# Patient Record
Sex: Female | Born: 1965 | Race: White | Hispanic: No | Marital: Married | Smoking: Never smoker
Health system: Southern US, Community
[De-identification: ages and names within clinical notes are randomized; demographics above are authoritative.]

## PROBLEM LIST (undated history)

## (undated) DIAGNOSIS — C4490 Unspecified malignant neoplasm of skin, unspecified: Secondary | ICD-10-CM

## (undated) DIAGNOSIS — J189 Pneumonia, unspecified organism: Secondary | ICD-10-CM

## (undated) DIAGNOSIS — N200 Calculus of kidney: Secondary | ICD-10-CM

## (undated) DIAGNOSIS — A770 Spotted fever due to Rickettsia rickettsii: Secondary | ICD-10-CM

## (undated) DIAGNOSIS — N946 Dysmenorrhea, unspecified: Secondary | ICD-10-CM

## (undated) DIAGNOSIS — B029 Zoster without complications: Secondary | ICD-10-CM

## (undated) DIAGNOSIS — G43909 Migraine, unspecified, not intractable, without status migrainosus: Secondary | ICD-10-CM

## (undated) HISTORY — DX: Dysmenorrhea, unspecified: N94.6

## (undated) HISTORY — DX: Unspecified malignant neoplasm of skin, unspecified: C44.90

## (undated) HISTORY — DX: Migraine, unspecified, not intractable, without status migrainosus: G43.909

## (undated) HISTORY — DX: Zoster without complications: B02.9

## (undated) HISTORY — DX: Spotted fever due to Rickettsia rickettsii: A77.0

## (undated) HISTORY — DX: Calculus of kidney: N20.0

---
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
## 1985-06-27 DIAGNOSIS — J189 Pneumonia, unspecified organism: Secondary | ICD-10-CM

## 1985-06-27 HISTORY — DX: Pneumonia, unspecified organism: J18.9

## 1997-11-19 ENCOUNTER — Other Ambulatory Visit: Admission: RE | Admit: 1997-11-19 | Discharge: 1997-11-19 | Payer: Self-pay | Admitting: Obstetrics and Gynecology

## 1998-10-03 ENCOUNTER — Inpatient Hospital Stay (HOSPITAL_COMMUNITY): Admission: AD | Admit: 1998-10-03 | Discharge: 1998-10-03 | Payer: Self-pay | Admitting: Obstetrics and Gynecology

## 1998-10-03 ENCOUNTER — Inpatient Hospital Stay (HOSPITAL_COMMUNITY): Admission: AD | Admit: 1998-10-03 | Discharge: 1998-10-05 | Payer: Self-pay | Admitting: Obstetrics and Gynecology

## 1998-11-13 ENCOUNTER — Other Ambulatory Visit: Admission: RE | Admit: 1998-11-13 | Discharge: 1998-11-13 | Payer: Self-pay | Admitting: Obstetrics and Gynecology

## 1999-08-31 ENCOUNTER — Encounter: Payer: Self-pay | Admitting: Family Medicine

## 1999-08-31 ENCOUNTER — Ambulatory Visit (HOSPITAL_COMMUNITY): Admission: RE | Admit: 1999-08-31 | Discharge: 1999-08-31 | Payer: Self-pay | Admitting: Family Medicine

## 2000-07-06 ENCOUNTER — Other Ambulatory Visit: Admission: RE | Admit: 2000-07-06 | Discharge: 2000-07-06 | Payer: Self-pay | Admitting: Obstetrics and Gynecology

## 2000-07-25 ENCOUNTER — Encounter: Payer: Self-pay | Admitting: Family Medicine

## 2000-07-25 ENCOUNTER — Ambulatory Visit (HOSPITAL_COMMUNITY): Admission: RE | Admit: 2000-07-25 | Discharge: 2000-07-25 | Payer: Self-pay | Admitting: Family Medicine

## 2001-08-22 ENCOUNTER — Other Ambulatory Visit: Admission: RE | Admit: 2001-08-22 | Discharge: 2001-08-22 | Payer: Self-pay | Admitting: Obstetrics and Gynecology

## 2002-09-25 ENCOUNTER — Other Ambulatory Visit: Admission: RE | Admit: 2002-09-25 | Discharge: 2002-09-25 | Payer: Self-pay | Admitting: Obstetrics and Gynecology

## 2003-10-13 ENCOUNTER — Other Ambulatory Visit: Admission: RE | Admit: 2003-10-13 | Discharge: 2003-10-13 | Payer: Self-pay | Admitting: Obstetrics and Gynecology

## 2003-10-26 HISTORY — PX: OTHER SURGICAL HISTORY: SHX169

## 2004-12-02 ENCOUNTER — Other Ambulatory Visit: Admission: RE | Admit: 2004-12-02 | Discharge: 2004-12-02 | Payer: Self-pay | Admitting: Obstetrics and Gynecology

## 2010-06-27 HISTORY — PX: ENDOMETRIAL ABLATION: SHX621

## 2010-11-17 ENCOUNTER — Encounter (HOSPITAL_COMMUNITY): Payer: BC Managed Care – PPO

## 2010-11-17 ENCOUNTER — Other Ambulatory Visit: Payer: Self-pay | Admitting: Obstetrics and Gynecology

## 2010-11-17 LAB — CBC
HCT: 40.4 % (ref 36.0–46.0)
Hemoglobin: 13.1 g/dL (ref 12.0–15.0)
MCH: 28.9 pg (ref 26.0–34.0)
MCHC: 32.4 g/dL (ref 30.0–36.0)
MCV: 89 fL (ref 78.0–100.0)
Platelets: 171 10*3/uL (ref 150–400)
RBC: 4.54 MIL/uL (ref 3.87–5.11)
RDW: 12.3 % (ref 11.5–15.5)
WBC: 2.9 10*3/uL — ABNORMAL LOW (ref 4.0–10.5)

## 2010-11-25 ENCOUNTER — Other Ambulatory Visit: Payer: Self-pay | Admitting: Obstetrics and Gynecology

## 2010-11-25 ENCOUNTER — Ambulatory Visit (HOSPITAL_COMMUNITY)
Admission: RE | Admit: 2010-11-25 | Discharge: 2010-11-25 | Disposition: A | Discharge status: Home / Self Care | Payer: BC Managed Care – PPO | Source: Ambulatory Visit | Attending: Obstetrics and Gynecology | Admitting: Obstetrics and Gynecology

## 2010-11-25 DIAGNOSIS — N840 Polyp of corpus uteri: Secondary | ICD-10-CM | POA: Insufficient documentation

## 2010-11-25 DIAGNOSIS — N920 Excessive and frequent menstruation with regular cycle: Secondary | ICD-10-CM | POA: Insufficient documentation

## 2010-11-25 DIAGNOSIS — Z01812 Encounter for preprocedural laboratory examination: Secondary | ICD-10-CM | POA: Insufficient documentation

## 2010-11-25 DIAGNOSIS — Z01818 Encounter for other preprocedural examination: Secondary | ICD-10-CM | POA: Insufficient documentation

## 2010-11-25 LAB — HCG, SERUM, QUALITATIVE: Preg, Serum: NEGATIVE

## 2010-11-27 NOTE — Discharge Summary (Signed)
  [name], [name]              ACCOUNT NO.:  [id number]  MEDICAL RECORD NO.:  [id number]           PATIENT TYPE:  O  LOCATION:  [facility]                          [facility]ILITY[facility]  [facility]  PHYSICIAN:  Juluis Mire, M.D.   DATE OF BIRTH:  [birth year]  DATE OF ADMISSION:  11/25/2010 DATE OF DISCHARGE:                              DISCHARGE SUMMARY   PREOPERATIVE DIAGNOSES:  Endometrial polyps.  Menorrhagia.  POSTOPERATIVE DIAGNOSES:  Endometrial polyps.  Menorrhagia.  OPERATIVE PROCEDURE:  Paracervical block.  Cervical dilatation with hysteroscopy, resection of endometrial polyps.  NovaSure ablation.  SURGEON:  Juluis Mire, MD.  ANESTHESIA:  General anesthesia along with paracervical block.  ESTIMATED BLOOD LOSS:  Minimal.  PACKS AND DRAINS:  None.  INTRAOPERATIVE BLOOD PLACED:  None.  COMPLICATIONS:  None.  INDICATIONS:  Dictated in history and physical.  PROCEDURES:  As follows:  The patient was taken to OR and placed in supine position.  After satisfactory level of general anesthesia was obtained, the patient was placed in the dorsal lithotomy position using the Allen stirrups.  Perineum and vagina were prepped out with Betadine and draped as a sterile field.  Spec was placed in the vaginal vault. The cervix was grasped with single-tooth tenaculum.  Paracervical block was instituted using 1% Xylocaine with epinephrine to the strength of 1:200,000.  Uterus sounded to 10 cm and the cervical length was 4 cm given this a cavity length of 6 cm.  Cervix was serially dilated to a size 31 Pratt dilator.  Operative hysteroscope was introduced, intrauterine cavity was distended using glycine.  Visualization revealed 2 endometrial polyps.  These were smooth and unremarkable.  These were resected along with endometrial curettings were sent for pathological review.  Total deficit was 30 mL.  At this point in time, we rinsed out the intrauterine cavity using lactated Ringer's for about 3  minutes until we felt the glycine had been removed.  The NovaSure was then brought in place and deployed.  We had a total cavity width of 3.7 cm. This give [place] a power setting of 122.  We passed the CO2 patency test ablation was undertaken for 1 minute and 5 seconds.  The NovaSure was removed intact.  Repeat hysteroscopy revealed peripherally universal ablation.  No signs of perforation. Hysteroscope was removed along with a single-tooth tenaculum.  The patient was taken out of the dorsal lithotomy position, once alert and extubated, transferred to recovery room in good condition.     Juluis Mire, M.D.     JSM/MEDQ  D:  11/25/2010  T:  11/25/2010  Job:  [id number]  Electronically Signed by Richardean Chimera M.D. on 11/27/2010 06:16:41 AM

## 2010-11-27 NOTE — H&P (Signed)
[name], [name]              ACCOUNT NO.:  [id number]  MEDICAL RECORD NO.:  [id number]           PATIENT TYPE:  O  LOCATION:  [facility]                          [facility]  PHYSICIAN:  Juluis Mire, M.D.   DATE OF BIRTH:  [birth year]  DATE OF ADMISSION:  11/25/2010 DATE OF DISCHARGE:                             HISTORY & PHYSICAL   The patient is a 45 year old gravida 2, para 2 female presents for hysteroscopy along with NovaSure ablation.  In relation to the present admission, the patient has regular cycle with increasing menstrual flow. She has 8 days of flow, 4-5 days being heavy, changing pads every 3 hours with clots and some increasing dysmenorrhea.  She is sexually active.  Husband has had a prior vasectomy.  She has no discomfort with intercourse.  Previous saline infusion ultrasound, she did have several endometrial polyps.  They were relatively small but findings highly suggestive of adenomyosis.  We had discussed options including just resecting the polyps versus resecting the polyps with ablated technique versus trying to control periods with hormonal agents such as birth control pills, Mirena IUD versus hysterectomy.  She now presents for hysteroscopy with NovaSure ablation.  ALLERGIES:  In terms of allergies, she is allergic to AUGMENTIN.  MEDICATIONS:  She is on several skin creams.  PAST MEDICAL HISTORY:  Usual childhood diseases with no significant sequelae, has had a history of skin cancers been removed in the past, history of kidney stones.  She has had 2 vaginal deliveries.  SOCIAL HISTORY:  No tobacco or alcohol use.  FAMILY HISTORY:  Noncontributory.  REVIEW OF SYSTEMS:  Noncontributory.  PHYSICAL EXAMINATION:  GENERAL:  The patient is afebrile. VITAL SIGNS:  Stable. HEENT:  The patient is normocephalic.  Pupils are equal, round and reactive to light and accommodation.  Extraocular movements were intact. Sclerae and conjunctivae are clear.   Oropharynx clear. NECK:  Without thyromegaly. BREASTS:  Not examined. LUNGS:  Clear. CARDIOVASCULAR:  Heart, regular rhythm and rate.  There are no murmurs or gallops. ABDOMEN:  Benign. PELVIC:  Normal external genitalia.  Vaginal mucosa is clear.  Cervix unremarkable.  Uterus upper limits of normal in size. ADNEXA:  Unremarkable. EXTREMITIES:  Trace edema. NEUROLOGIC:  Grossly within normal limits.  IMPRESSION: 1. Menorrhagia secondary to adenomyosis. 2. Endometrial polyps.  PLAN:  The patient to undergo hysteroscopic resection of polyps followed by NovaSure ablation.  Success rates for ablation reported about 80% with amenorrheic rates of 40%.  The nature of the procedure explained along with alternative risks explained including the risk of infection. The risk of vascular injury that could lead to hemorrhage requiring transfusion with the risk of AIDS or hepatitis.  Excessive bleeding could require hysterectomy.  There is a risk of perforation leading to injury to adjacent organs such as bowel that could require further exploratory surgery.  Risk of deep venous thrombosis and pulmonary embolus.  The patient does understand the potential risks, complications and alternatives.     Juluis Mire, M.D.     JSM/MEDQ  D:  11/25/2010  T:  11/25/2010  Job:  [id number]  Electronically Signed  by Richardean Chimera M.D. on 11/27/2010 06:16:39 AM

## 2011-08-04 ENCOUNTER — Other Ambulatory Visit: Payer: Self-pay | Admitting: Dermatology

## 2013-04-11 ENCOUNTER — Other Ambulatory Visit: Payer: Self-pay | Admitting: Dermatology

## 2018-12-10 ENCOUNTER — Other Ambulatory Visit: Payer: Self-pay | Admitting: Obstetrics and Gynecology

## 2018-12-10 DIAGNOSIS — Z9189 Other specified personal risk factors, not elsewhere classified: Secondary | ICD-10-CM

## 2019-02-01 ENCOUNTER — Ambulatory Visit
Admission: RE | Admit: 2019-02-01 | Discharge: 2019-02-01 | Disposition: A | Discharge status: Home / Self Care | Payer: BC Managed Care – PPO | Source: Ambulatory Visit | Attending: Family Medicine | Admitting: Family Medicine

## 2019-02-01 ENCOUNTER — Other Ambulatory Visit: Payer: Self-pay | Admitting: Family Medicine

## 2019-02-01 ENCOUNTER — Other Ambulatory Visit: Payer: Self-pay

## 2019-02-01 DIAGNOSIS — R519 Headache, unspecified: Secondary | ICD-10-CM

## 2019-04-01 ENCOUNTER — Encounter: Payer: Self-pay | Admitting: *Deleted

## 2019-04-01 ENCOUNTER — Ambulatory Visit: Payer: BC Managed Care – PPO | Admitting: Neurology

## 2019-04-01 ENCOUNTER — Encounter: Payer: Self-pay | Admitting: Neurology

## 2019-04-01 ENCOUNTER — Other Ambulatory Visit: Payer: Self-pay

## 2019-04-01 DIAGNOSIS — M5481 Occipital neuralgia: Secondary | ICD-10-CM | POA: Diagnosis not present

## 2019-04-01 MED ORDER — METHYLPREDNISOLONE 4 MG PO TBPK: ORAL_TABLET | ORAL | 1 refills | Status: AC

## 2019-04-01 NOTE — Progress Notes (Signed)
[facility]    Provider:  Dr Jaynee Eagles Requesting Provider:  Antony Contras, MD Primary Care Provider:  Arvella Nigh, MD  CC:  Headache, resolved  HPI:  [name] is a 53 y.o. female here as requested by Antony Contras, MD. PMHx migraines, kidney stones, dysmenorrhea. She hasn't had migraines in years since [place]. Migraines started in 5th grade. She rarely gets headache maybe sometimes due to dehydration. This was unusual. She still has a period, she is not in menopause but she is at that age, she woke up with a headache on 01/26/2019. She has just gotten back from a camping trip in [place] and came back the night before and they were driving straight we days in the car. She woke up with a headache the morning she woke up at home, this heaadche went on for days, she called Dr. Moreen Fowler. Prednisone helped but it came back again. If she leaned over or put her head in a certain position it hurt the back left (points to the left occipital nerve), electric, shooting an dull ache. She talked to Colin Benton. Occasionally she still feels it but mostly gone, dull ache. Every now and then it hurts. No other focal neurologic deficits, associated symptoms, inciting events or modifiable factors.  Reviewed notes, labs and imaging from outside physicians, which showed:  I reviewed Dr. Harvie Bridge notes.  Patient was last seen on January 31, 2019 with a severe headache.  The headache started on Saturday, August 1 when she awoke with a severe terrible headache.  This was after a camping trip where she returned home January 25, 2019 and did not have a headache.  She tried Tylenol but that did not help a massage did not help, her symptoms continue to persist.  She tried to play some tennis a few days later but awoke with an even worse headache several days later.  Headache severe, when she leans over she has severe headache in her frontal aspect, localized headache in her left occipital region, headache 8 out of  10 in a scale of 10.  She was in high school she is to have severe migraine headaches with associated nausea, vomiting and time numbness however no migraines since college.  Never had a headache last this long without improving.  No recent illnesses.  No associated nausea or vomiting.  No vision changes but her eyes do hurt.  Temperature normal.  No head injuries.  No tick bites.  She was started on prednisone 6-day taper.    CT 02/01/2019 showed No acute intracranial abnormalities including mass lesion or mass effect, hydrocephalus, extra-axial fluid collection, midline shift, hemorrhage, or acute infarction, large ischemic events (personally reviewed images)     Review of Systems: Patient complains of symptoms per HPI as well as the following symptoms: migraines.  Pertinent negatives and positives per HPI. All others negative.   Social History   Socioeconomic History  . Marital status: Married    Spouse name: Not on file  . Number of children: 2  . Years of education: Not on file  . Highest education level: Doctorate  Occupational History  . Not on file  Social Needs  . Financial resource strain: Not on file  . Food insecurity    Worry: Not on file    Inability: Not on file  . Transportation needs    Medical: Not on file    Non-medical: Not on file  Tobacco Use  . Smoking status: Never Smoker  . Smokeless  tobacco: Never Used  Substance and Sexual Activity  . Alcohol use: Yes    Comment: rare, wine, 1-2 per month  . Drug use: Never  . Sexual activity: Not on file    Comment: husband had vasectomy  Lifestyle  . Physical activity    Days per week: Not on file    Minutes per session: Not on file  . Stress: Not on file  Relationships  . Social Herbalist on phone: Not on file    Gets together: Not on file    Attends religious service: Not on file    Active member of club or organization: Not on file    Attends meetings of clubs or organizations: Not on file     Relationship status: Not on file  . Intimate partner violence    Fear of current or ex partner: Not on file    Emotionally abused: Not on file    Physically abused: Not on file    Forced sexual activity: Not on file  Other Topics Concern  . Not on file  Social History Narrative   Lives at home with husband   Right handed   Caffeine: none    Family History  Problem Relation Age of Onset  . Stroke Mother   . Other Mother        blood disorder  . Hypertension Father   . Cancer Brother        of mouth glands  . Heart attack Maternal Grandfather        in 32s  . Breast cancer Paternal Grandmother        80  . Heart attack Paternal Grandfather   . Hypertension Paternal Grandfather   . Headache Neg Hx   . Migraines Neg Hx     Past Medical History:  Diagnosis Date  . Dysmenorrhea   . Kidney stones   . Migraines    from 5th grade to college  . Pneumonia 1987  . [facility] spotted fever    treated with doxycycline  . Shingles   . Skin cancer     Patient Active Problem List   Diagnosis Date Noted  . Occipital neuralgia of left side 04/01/2019    Past Surgical History:  Procedure Laterality Date  . APPENDECTOMY  1982  . ENDOMETRIAL ABLATION  2012  . kidney stone removal  10/2003    Current Outpatient Medications  Medication Sig Dispense Refill  . doxycycline (ADOXA) 50 MG tablet Take 50 mg by mouth daily.    Marland Kitchen PRESCRIPTION MEDICATION Allergy shot once weekly for dust mites & mold    . methylPREDNISolone (MEDROL DOSEPAK) 4 MG TBPK tablet Take all pills in the morning with food for 6 days. 6-5-4-3-2-1 21 tablet 1   No current facility-administered medications for this visit.     Allergies as of 04/01/2019 - Review Complete 04/01/2019  Allergen Reaction Noted  . Amoxicillin-pot clavulanate Anaphylaxis 04/01/2019    Vitals: BP 108/64 (BP Location: Right Arm, Patient Position: Sitting)   Pulse (!) 58   Temp (!) 97.5 F (36.4 C) Comment: taken by  check-in staff  Ht 5\' 10"  (1.778 m)   Wt 148 lb (67.1 kg)   LMP 03/07/2019 (Within Days)   BMI 21.24 kg/m  Last Weight:  Wt Readings from Last 1 Encounters:  04/01/19 148 lb (67.1 kg)   Last Height:   Ht Readings from Last 1 Encounters:  04/01/19 5\' 10"  (1.778 m)  Physical exam: Exam: Gen: NAD, conversant, well nourised,  well groomed                     CV: RRR, no MRG. No Carotid Bruits. No peripheral edema, warm, nontender Eyes: Conjunctivae clear without exudates or hemorrhage  Neuro: Detailed Neurologic Exam  Speech:    Speech is normal; fluent and spontaneous with normal comprehension.  Cognition:    The patient is oriented to person, place, and time;     recent and remote memory intact;     language fluent;     normal attention, concentration,     fund of knowledge Cranial Nerves:    The pupils are equal, round, and reactive to light. The fundi are normal and spontaneous venous pulsations are present. Visual fields are full to finger confrontation. Extraocular movements are intact. Trigeminal sensation is intact and the muscles of mastication are normal. The face is symmetric. The palate elevates in the midline. Hearing intact. Voice is normal. Shoulder shrug is normal. The tongue has normal motion without fasciculations.   Coordination:    Normal finger to nose and heel to shin. Normal rapid alternating movements.   Gait:    Heel-toe and tandem gait are normal.   Motor Observation:    No asymmetry, no atrophy, and no involuntary movements noted. Tone:    Normal muscle tone.    Posture:    Posture is normal. normal erect    Strength:    Strength is V/V in the upper and lower limbs.      Sensation: intact to LT     Reflex Exam:  DTR's:    Deep tendon reflexes in the upper and lower extremities are normal bilaterally.   Toes:    The toes are downgoing bilaterally.   Clonus:    Clonus is absent.    Assessment/Plan:  Very lovely patient here for  occipital neuralgia likely caused from being in the car for 3 days, positional, a pinched nerve. She is improved. Steroids helped.  - If the pain returns call the office and we will get you in for a nerve block - Keep a medrol dosepak on hand for emergency use - follow up as needed.   Meds ordered this encounter  Medications  . methylPREDNISolone (MEDROL DOSEPAK) 4 MG TBPK tablet    Sig: Take all pills in the morning with food for 6 days. 6-5-4-3-2-1    Dispense:  21 tablet    Refill:  1    Cc: Arvella Nigh, MD,  Antony Contras MD  Sarina Ill, MD  [facility] [address] [place], [place] [address]  Phone [phone] Fax [phone]

## 2019-04-01 NOTE — Patient Instructions (Addendum)
Occipital Neuralgia  Occipital neuralgia is a type of headache that causes brief episodes of very bad pain in the back of your head. Pain from occipital neuralgia may spread (radiate) to other parts of your head. These headaches may be caused by irritation of the nerves that leave your spinal cord high up in your neck, just below the base of your skull (occipital nerves). Your occipital nerves transmit sensations from the back of your head, the top of your head, and the areas behind your ears. What are the causes? This condition can occur without any known cause (primary headache syndrome). In other cases, this condition is caused by pressure on or irritation of one of the two occipital nerves. Pressure and irritation may be due to:  Muscle spasm in the neck.  Neck injury.  Wear and tear of the vertebrae in the neck (osteoarthritis).  Disease of the disks that separate the vertebrae.  Swollen blood vessels that put pressure on the occipital nerves.  Infections.  Tumors.  Diabetes. What are the signs or symptoms? This condition causes brief burning, stabbing, electric, shocking, or shooting pain which can radiate to the top of the head. It can happen on one side or both sides of the head. It can also cause:  Pain behind the eye.  Pain triggered by neck movement or hair brushing.  Scalp tenderness.  Aching in the back of the head between episodes of very bad pain.  Pain gets worse with exposure to bright lights. How is this diagnosed? There is no test that diagnoses this condition. Your health care provider may diagnose this condition based on a physical exam and your symptoms. Other tests may be done, such as:  Imaging studies of the brain and neck (cervical spine), such as an MRI or CT scan. These look for causes of pinched nerves.  Applying pressure to the nerves in the neck to try to re-create the pain.  Injection of numbing medicine into the occipital nerve areas to see if  pain goes away (diagnostic nerve block). How is this treated? Treatment for this condition may begin with simple measures, such as:  Rest.  Massage.  Applying heat or cold on the area.  Over-the-counter pain relievers. If these measures do not work, you may need other treatments, including:  Medicines, such as: ? Prescription-strength anti-inflammatory medicines. ? Muscle relaxants. ? Anti-seizure medicines, which can relieve pain. ? Antidepressants, which can relieve pain. ? Injected medicines, such as medicines that numb the area (local anesthetic) and steroids.  Pulsed radiofrequency ablation. This is when wires are implanted to deliver electrical impulses that block pain signals from the occipital nerve.  Surgery to relieve nerve pressure.  Physical therapy. Follow these instructions at home: Pain management      Avoid any activities that cause pain.  Rest when you have an attack of pain.  Try gentle massage to relieve pain.  Try a different pillow or sleeping position.  If directed, apply heat to the affected area as told by your health care provider. Use the heat source that your health care provider recommends, such as a moist heat pack or a heating pad. ? Place a towel between your skin and the heat source. ? Leave the heat on for 20-30 minutes. ? Remove the heat if your skin turns bright red. This is especially important if you are unable to feel pain, heat, or cold. You may have a greater risk of getting burned.  If directed, apply ice to the   back of the head and neck area as told by your health care provider. ? Put ice in a plastic bag. ? Place a towel between your skin and the bag. ? Leave the ice on for 20 minutes, 2-3 times per day. General instructions  Take over-the-counter and prescription medicines only as told by your health care provider.  Avoid things that make your symptoms worse, such as bright lights.  Try to stay active. Get regular  exercise that does not cause pain. Ask your health care provider to suggest safe exercises for you.  Work with a physical therapist to learn stretching exercises you can do at home.  Practice good posture.  Keep all follow-up visits as told by your health care provider. This is important. Contact a health care provider if:  Your medicine is not working.  You have new or worsening symptoms. Get help right away if:  You have very bad head pain that does not go away.  You have a sudden change in vision, balance, or speech. Summary  Occipital neuralgia is a type of headache that causes brief episodes of very bad pain in the back of your head.  Pain from occipital neuralgia may spread (radiate) to other parts of your head.  Treatment for this condition includes rest, massage, and medicines. This information is not intended to replace advice given to you by your health care provider. Make sure you discuss any questions you have with your health care provider. Document Released: 06/07/2001 Document Revised: 05/30/2017 Document Reviewed: 08/18/2016 Elsevier Patient Education  [place].  Occipital Nerve Block Patient Information  Description: The occipital nerves originate in the cervical (neck) spinal cord and travel upward through muscle and tissue to supply sensation to the back of the head and top of the scalp.  In addition, the nerves control some of the muscles of the scalp.  Occipital neuralgia is an irritation of these nerves which can cause headaches, numbness of the scalp, and neck discomfort.     The occipital nerve block will interrupt nerve transmission through these nerves and can relieve pain and spasm.  The block consists of insertion of a small needle under the skin in the back of the head to deposit local anesthetic (numbing medicine) and/or steroids around the nerve.  The entire block usually lasts less than 5 minutes.  Conditions which may be treated by  occipital blocks:   Muscular pain and spasm of the scalp  Nerve irritation, back of the head  Headaches  Upper neck pain  Preparation for the injection:  1. Do not eat any solid food or dairy products within 8 hours of your appointment. 2. You may drink clear liquids up to 3 hours before appointment.  Clear liquids include water, black coffee, juice or soda.  No milk or cream please. 3. You may take your regular medication, including pain medications, with a sip of water before you appointment.  Diabetics should hold regular insulin (if taken separately) and take 1/2 normal NPH dose the morning of the procedure.  Carry some sugar containing items with you to your appointment. 4. A driver must accompany you and be prepared to drive you home after your procedure. 5. Bring all your current medications with you. 6. An IV may be inserted and sedation may be given at the discretion of the physician. 7. A blood pressure cuff, EKG, and other monitors will often be applied during the procedure.  Some patients may need to have extra oxygen administered  for a short period. 8. You will be asked to provide medical information, including your allergies and medications, prior to the procedure.  We must know immediately if you are taking blood thinners (like Coumadin/Warfarin) or if you are allergic to IV iodine contrast (dye).  We must know if you could possible be pregnant.  9. Do not wear a high collared shirt or turtleneck.  Tie long hair up in the back if possible.  Possible side-effects:   Bleeding from needle site  Infection (rare, may require surgery)  Nerve injury (rare)  Hair on back of neck can be tinged with iodine scrub (this will wash out)  Light-headedness (temporary)  Pain at injection site (several days)  Decreased blood pressure (rare, temporary)  Seizure (very rare)  Call if you experience:   Hives or difficulty breathing ( go to the emergency room)  Inflammation or  drainage at the injection site(s)  Please note:  Although the local anesthetic injected can often make your painful muscles or headache feel good for several hours after the injection, the pain may return.  It takes 3-7 days for steroids to work.  You may not notice any pain relief for at least one week.  If effective, we will often do a series of injections spaced 3-6 weeks apart to maximally decrease your pain.  If you have any questions, please call [phone] [facility]  Methylprednisolone tablets What is this medicine? METHYLPREDNISOLONE (meth ill pred NISS oh lone) is a corticosteroid. It is commonly used to treat inflammation of the skin, joints, lungs, and other organs. Common conditions treated include asthma, allergies, and arthritis. It is also used for other conditions, such as blood disorders and diseases of the adrenal glands. This medicine may be used for other purposes; ask your health care provider or pharmacist if you have questions. COMMON BRAND NAME(S): Medrol, Medrol Dosepak What should I tell my health care provider before I take this medicine? They need to know if you have any of these conditions: Cushing's syndrome eye disease, vision problems diabetes glaucoma heart disease high blood pressure infection (especially a virus infection such as chickenpox, cold sores, or herpes) liver disease mental illness myasthenia gravis osteoporosis recently received or scheduled to receive a vaccine seizures stomach or intestine problems thyroid disease an unusual or allergic reaction to lactose, methylprednisolone, other medicines, foods, dyes, or preservatives pregnant or trying to get pregnant breast-feeding How should I use this medicine? Take this medicine by mouth with a glass of water. Follow the directions on the prescription label. Take this medicine with food. If you are taking this medicine once a day, take it in the  morning. Do not take it more often than directed. Do not suddenly stop taking your medicine because you may develop a severe reaction. Your doctor will tell you how much medicine to take. If your doctor wants you to stop the medicine, the dose may be slowly lowered over time to avoid any side effects. Talk to your pediatrician regarding the use of this medicine in children. Special care may be needed. Overdosage: If you think you have taken too much of this medicine contact a poison control center or emergency room at once. NOTE: This medicine is only for you. Do not share this medicine with others. What if I miss a dose? If you miss a dose, take it as soon as you can. If it is almost time for your next dose, talk to your doctor or  health care professional. You may need to miss a dose or take an extra dose. Do not take double or extra doses without advice. What may interact with this medicine? Do not take this medicine with any of the following medications: alefacept echinacea live virus vaccines metyrapone mifepristone This medicine may also interact with the following medications: amphotericin B aspirin and aspirin-like medicines certain antibiotics like erythromycin, clarithromycin, troleandomycin certain medicines for diabetes certain medicines for fungal infections like ketoconazole certain medicines for seizures like carbamazepine, phenobarbital, phenytoin certain medicines that treat or prevent blood clots like warfarin cholestyramine cyclosporine digoxin diuretics female hormones, like estrogens and birth control pills isoniazid NSAIDs, medicines for pain inflammation, like ibuprofen or naproxen other medicines for myasthenia gravis rifampin vaccines This list may not describe all possible interactions. Give your health care provider a list of all the medicines, herbs, non-prescription drugs, or dietary supplements you use. Also tell them if you smoke, drink alcohol, or use  illegal drugs. Some items may interact with your medicine. What should I watch for while using this medicine? Tell your doctor or healthcare professional if your symptoms do not start to get better or if they get worse. Do not stop taking except on your doctor's advice. You may develop a severe reaction. Your doctor will tell you how much medicine to take. This medicine may increase your risk of getting an infection. Tell your doctor or health care professional if you are around anyone with measles or chickenpox, or if you develop sores or blisters that do not heal properly. This medicine may increase blood sugar levels. Ask your healthcare provider if changes in diet or medicines are needed if you have diabetes. Tell your doctor or health care professional right away if you have any change in your eyesight. Using this medicine for a long time may increase your risk of low bone mass. Talk to your doctor about bone health. What side effects may I notice from receiving this medicine? Side effects that you should report to your doctor or health care professional as soon as possible: allergic reactions like skin rash, itching or hives, swelling of the face, lips, or tongue bloody or tarry stools hallucination, loss of contact with reality muscle cramps muscle pain palpitations signs and symptoms of high blood sugar such as being more thirsty or hungry or having to urinate more than normal. You may also feel very tired or have blurry vision. signs and symptoms of infection like fever or chills; cough; sore throat; pain or trouble passing urine Side effects that usually do not require medical attention (report to your doctor or health care professional if they continue or are bothersome): changes in emotions or mood constipation diarrhea excessive hair growth on the face or body headache nausea, vomiting trouble sleeping weight gain This list may not describe all possible side effects. Call your  doctor for medical advice about side effects. You may report side effects to FDA at 1-800-FDA-1088. Where should I keep my medicine? Keep out of the reach of children. Store at room temperature between 20 and 25 degrees C (68 and 77 degrees F). Throw away any unused medicine after the expiration date. NOTE: This sheet is a summary. It may not cover all possible information. If you have questions about this medicine, talk to your doctor, pharmacist, or health care provider.  2020 Elsevier/Gold Standard (2018-03-15 09:19:36)

## 2021-04-26 ENCOUNTER — Other Ambulatory Visit: Payer: Self-pay | Admitting: Obstetrics and Gynecology

## 2021-04-26 DIAGNOSIS — R928 Other abnormal and inconclusive findings on diagnostic imaging of breast: Secondary | ICD-10-CM

## 2021-04-30 ENCOUNTER — Other Ambulatory Visit: Payer: Self-pay | Admitting: Obstetrics and Gynecology

## 2021-04-30 DIAGNOSIS — Z803 Family history of malignant neoplasm of breast: Secondary | ICD-10-CM

## 2021-05-14 ENCOUNTER — Other Ambulatory Visit: Payer: BC Managed Care – PPO

## 2021-06-30 ENCOUNTER — Ambulatory Visit: Payer: BC Managed Care – PPO

## 2021-06-30 ENCOUNTER — Ambulatory Visit
Admission: RE | Admit: 2021-06-30 | Discharge: 2021-06-30 | Disposition: A | Discharge status: Home / Self Care | Payer: BC Managed Care – PPO | Source: Ambulatory Visit | Attending: Obstetrics and Gynecology | Admitting: Obstetrics and Gynecology

## 2021-06-30 DIAGNOSIS — R928 Other abnormal and inconclusive findings on diagnostic imaging of breast: Secondary | ICD-10-CM

## 2022-05-17 ENCOUNTER — Other Ambulatory Visit: Payer: Self-pay | Admitting: Obstetrics and Gynecology

## 2022-05-17 DIAGNOSIS — Z803 Family history of malignant neoplasm of breast: Secondary | ICD-10-CM

## 2022-06-09 ENCOUNTER — Ambulatory Visit
Admission: RE | Admit: 2022-06-09 | Discharge: 2022-06-09 | Disposition: A | Discharge status: Home / Self Care | Payer: BC Managed Care – PPO | Source: Ambulatory Visit | Attending: Obstetrics and Gynecology | Admitting: Obstetrics and Gynecology

## 2022-06-09 DIAGNOSIS — Z803 Family history of malignant neoplasm of breast: Secondary | ICD-10-CM

## 2022-06-09 MED ORDER — GADOPICLENOL 0.5 MMOL/ML IV SOLN
7.0000 mL | Freq: Once | INTRAVENOUS | Status: AC | PRN
  Administered 2022-06-09: 7 mL via INTRAVENOUS

## 2022-09-10 IMAGING — MG DIGITAL DIAGNOSTIC BILAT W/ TOMO W/ CAD
8 of 10 series · 8 of 22 positions shown · non-contrast
Comparison: Previous exam(s).

CLINICAL DATA: Patient was recalled from screening mammogram for a
possible mass in the right breast and left breast calcifications.

EXAM:
DIGITAL DIAGNOSTIC BILATERAL MAMMOGRAM WITH TOMOSYNTHESIS AND CAD
TECHNIQUE: Bilateral digital diagnostic mammography and breast tomosynthesis
was performed. The images were evaluated with computer-aided
detection.

[L ML (1 of 2)]
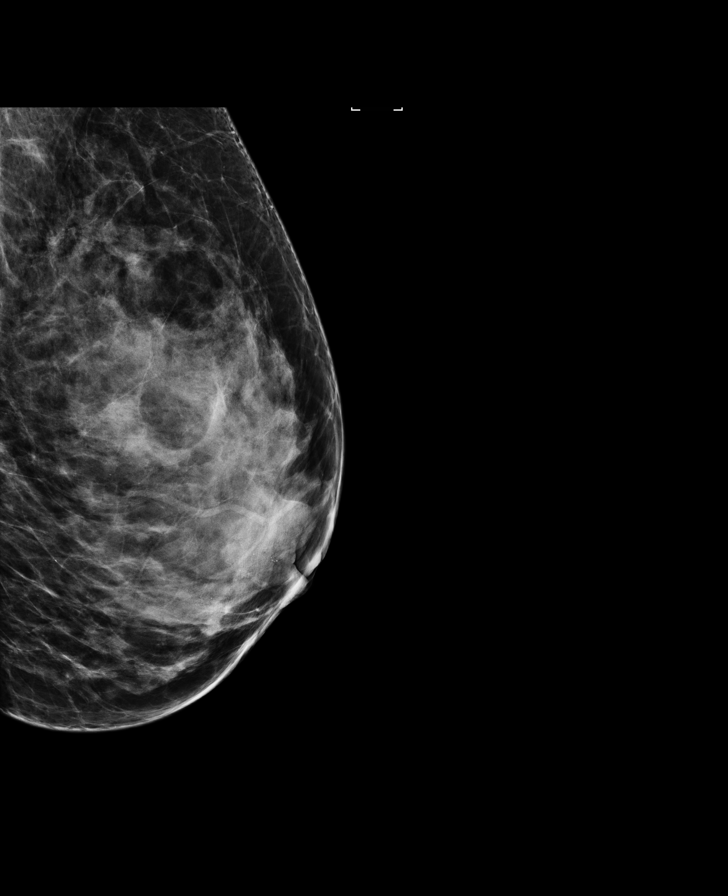

[L CC]
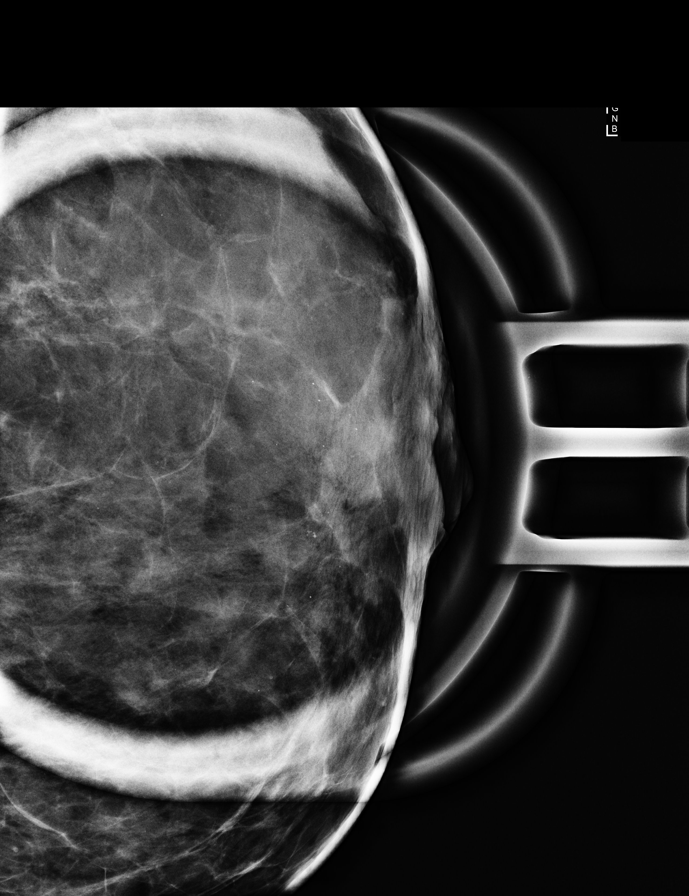

[L MLO]
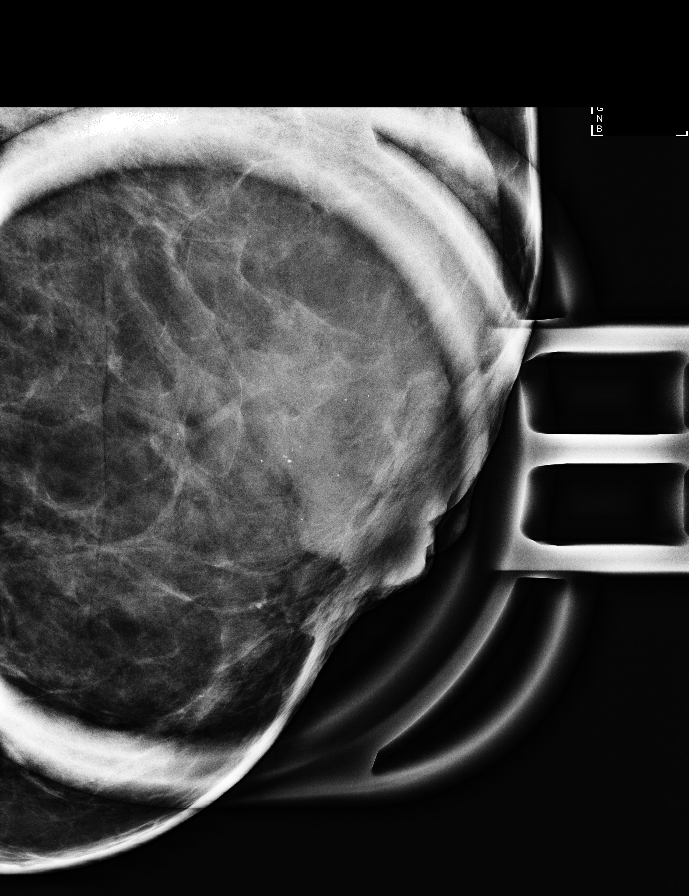

[L ML (2 of 2)]
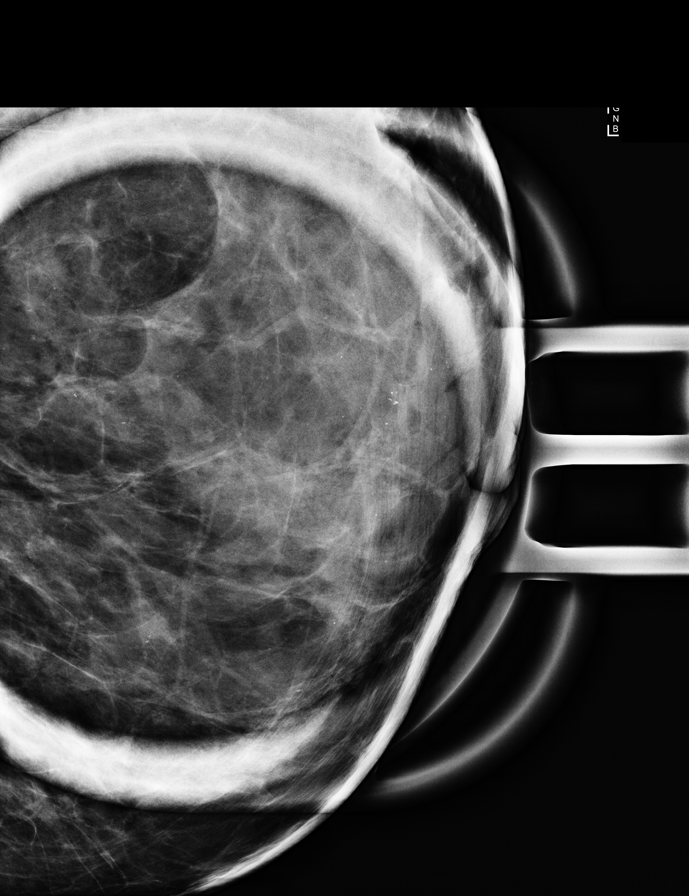

[R ML synth-2D]
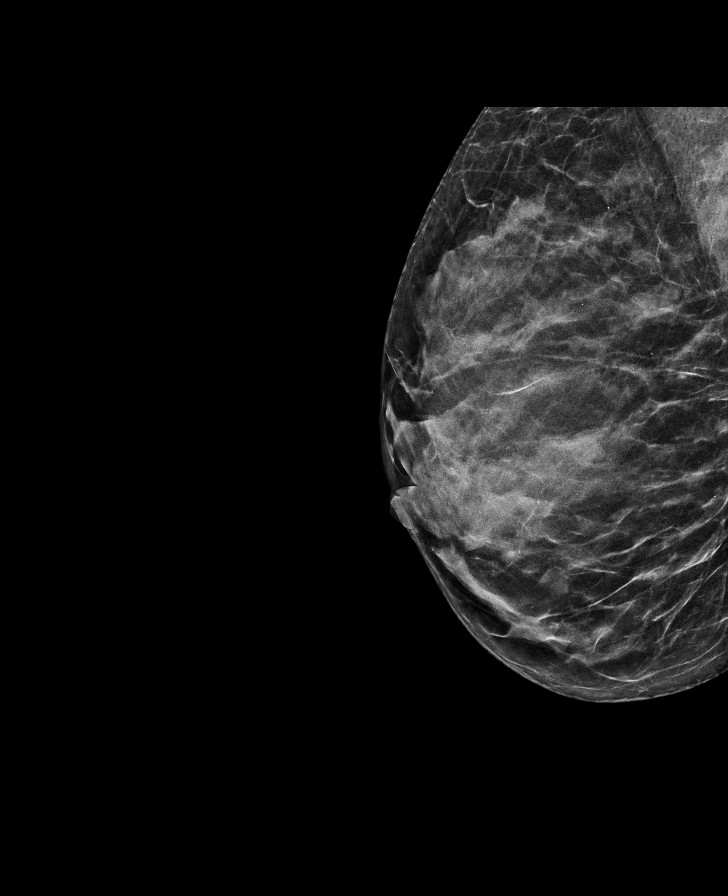

[R MLO synth-2D (1 of 2)]
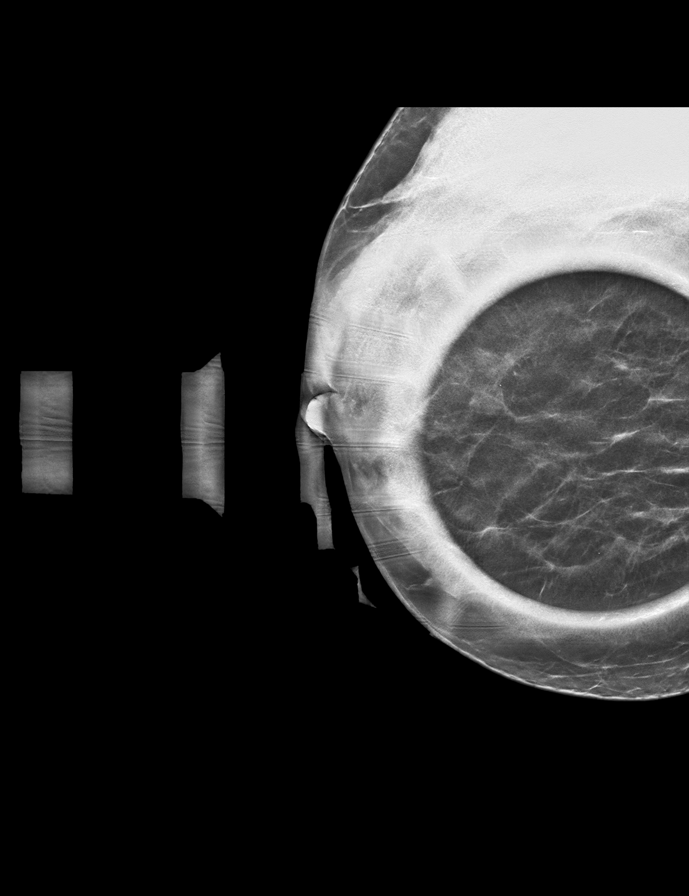

[R MLO synth-2D (2 of 2)]
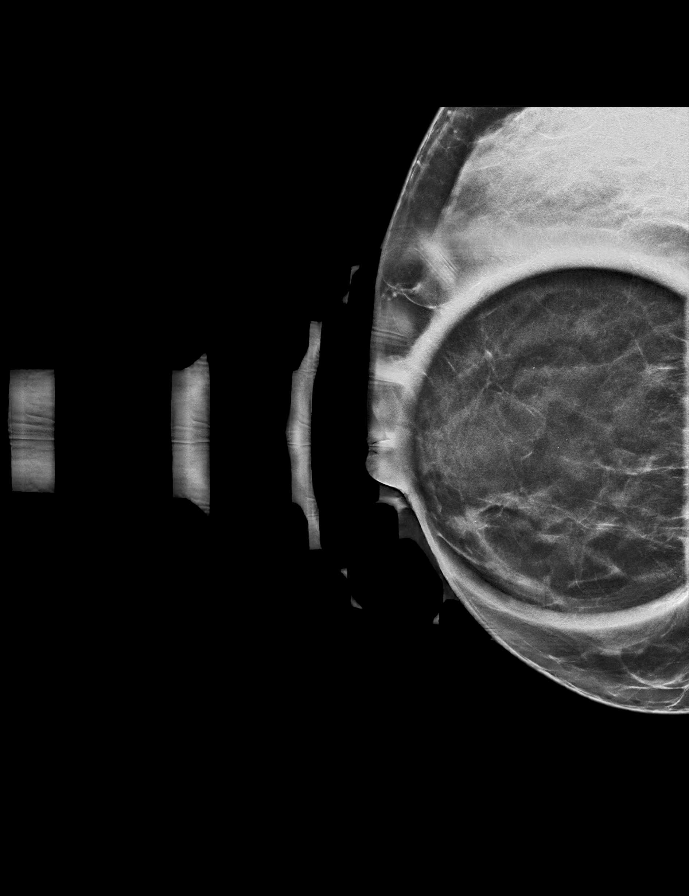

[R MLO tomo · tomo slice 25/49.0]
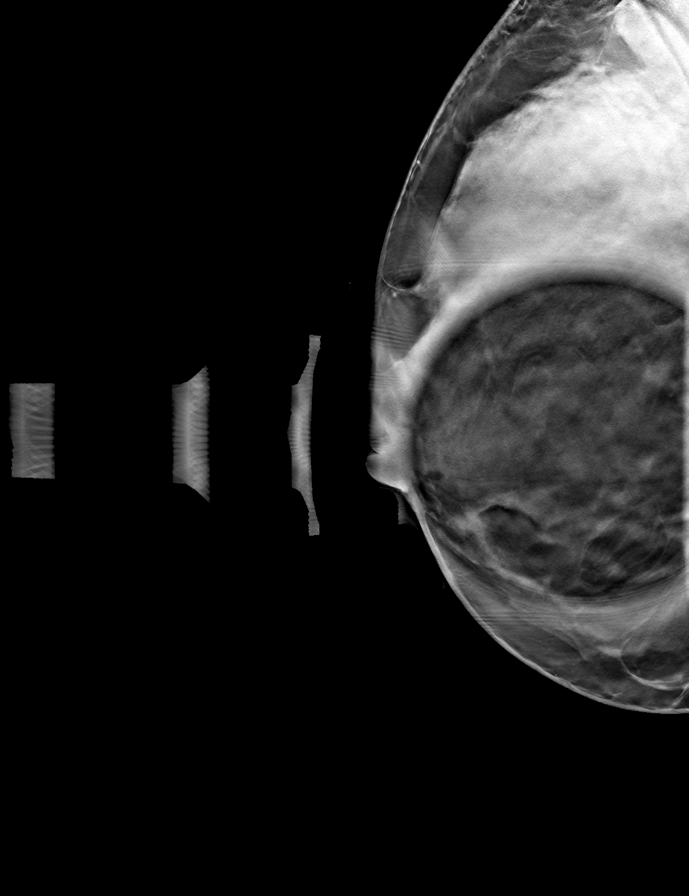

[8 of 22 positions shown; findings below may reference images not displayed]

ACR Breast Density Category c: The breast tissue is heterogeneously
dense, which may obscure small masses.
FINDINGS: Additional imaging of both breast were obtained. No persistent mass
is seen in the right breast. Diffuse punctate calcifications are
seen anteriorly in the left breast. They have a similar appearance
from prior exams. No suspicious calcifications identified.
IMPRESSION: No evidence of malignancy in either breast.

RECOMMENDATION:
Bilateral screening mammogram in Sunday March, 2022 is recommended.

I have discussed the findings and recommendations with the patient.
If applicable, a reminder letter will be sent to the patient
regarding the next appointment.

BI-RADS CATEGORY  2: Benign.

## 2023-05-11 ENCOUNTER — Ambulatory Visit (HOSPITAL_COMMUNITY): Payer: BC Managed Care – PPO

## 2023-05-11 ENCOUNTER — Other Ambulatory Visit: Payer: Self-pay

## 2023-05-11 ENCOUNTER — Encounter (HOSPITAL_COMMUNITY): Payer: Self-pay | Admitting: Emergency Medicine

## 2023-05-11 ENCOUNTER — Ambulatory Visit (HOSPITAL_COMMUNITY)
Admission: EM | Admit: 2023-05-11 | Discharge: 2023-05-11 | Disposition: A | Discharge status: Home / Self Care | Payer: BC Managed Care – PPO | Attending: Internal Medicine | Admitting: Internal Medicine

## 2023-05-11 DIAGNOSIS — S62102A Fracture of unspecified carpal bone, left wrist, initial encounter for closed fracture: Secondary | ICD-10-CM | POA: Diagnosis not present

## 2023-05-11 DIAGNOSIS — S6292XA Unspecified fracture of left wrist and hand, initial encounter for closed fracture: Secondary | ICD-10-CM | POA: Diagnosis not present

## 2023-05-11 NOTE — Discharge Instructions (Addendum)
You have fractured your left wrist. We placed your left wrist in a splint, avoid getting the splint wet. Wear splint at all times and do not remove it until your orthopedic follow-up appointment.  Rest, ice, elevate, and compress the injury to reduce swelling and inflammation.  Please take ibuprofen 800mg  every 8 hours and/or tylenol regular strength 650mg  every 6 hours as needed with food for pain. You may purchase these medications over the counter (4 200mg  ibuprofen pills= 800mg ).   If you still have pain despite taking ibuprofen regularly, this is called breakthrough pain.    Schedule an appointment with the orthopedic provider listed on your paperwork for follow-up in the next 3-5 days.  Return if you experience worsening pain, numbness, tingling, skin color changes, or any other concerning symptoms. If symptoms are severe, please go to the ER. I hope you feel better!!

## 2023-05-11 NOTE — ED Notes (Signed)
Called ortho tech 

## 2023-05-11 NOTE — ED Triage Notes (Signed)
Patient states she was playing tennis and fell backwards and fell on her left wrist. Pain 3/10.

## 2023-05-11 NOTE — ED Provider Notes (Addendum)
[facility]    CSN: [id number] Arrival date & time: 05/11/23  1904      History   Chief Complaint No chief complaint on file.   HPI [name] is a 57 y.o. female.   Was playing indoor tennis and tripped over her foot and landed with her hand facing backwards and felt a pop/crunch. Initially it did hurt and she tried to just let it rest, however she has started having swelling and her pain is increasing. She states it feels tight and she has increased pain with opening and closing her hand as well. She did take advil prior to playing tennis but has not taken anything since.   The history is provided by the patient and the spouse.    Past Medical History:  Diagnosis Date   Dysmenorrhea    Kidney stones    Migraines    from 5th grade to college   Pneumonia 1987   [facility] spotted fever    treated with doxycycline   Shingles    Skin cancer     Patient Active Problem List   Diagnosis Date Noted   Occipital neuralgia of left side 04/01/2019    Past Surgical History:  Procedure Laterality Date   APPENDECTOMY  1982   ENDOMETRIAL ABLATION  2012   kidney stone removal  10/2003    OB History   No obstetric history on file.      Home Medications    Prior to Admission medications   Medication Sig Start Date End Date Taking? Authorizing Provider  doxycycline (ADOXA) 50 MG tablet Take 50 mg by mouth daily. 03/26/19   [provider]  methylPREDNISolone (MEDROL DOSEPAK) 4 MG TBPK tablet Take all pills in the morning with food for 6 days. 6-5-4-3-2-1 04/01/19   Anson Fret, MD  PRESCRIPTION MEDICATION Allergy shot once weekly for dust mites & mold    [provider]    Family History Family History  Problem Relation Age of Onset   Stroke Mother    Other Mother        blood disorder   Hypertension Father    Cancer Brother        of mouth glands   Heart attack Maternal Grandfather        in 57s   Breast cancer  Paternal Grandmother        1980   Heart attack Paternal Grandfather    Hypertension Paternal Grandfather    Headache Neg Hx    Migraines Neg Hx     Social History Social History   Tobacco Use   Smoking status: Never   Smokeless tobacco: Never  Vaping Use   Vaping status: Never Used  Substance Use Topics   Alcohol use: Yes    Comment: rare, wine, 1-2 per month   Drug use: Never     Allergies   Amoxicillin-pot clavulanate   Review of Systems Review of Systems  Musculoskeletal:        Left forearm pain and swelling   All other systems reviewed and are negative.    Physical Exam Triage Vital Signs ED Triage Vitals  Encounter Vitals Group     BP 05/11/23 1913 119/67     Systolic BP Percentile --      Diastolic BP Percentile --      Pulse Rate 05/11/23 1913 64     Resp 05/11/23 1913 14     Temp 05/11/23 1913 97.6 F (36.4 C)  Temp Source 05/11/23 1913 Oral     SpO2 05/11/23 1913 97 %     Weight --      Height --      Head Circumference --      Peak Flow --      Pain Score 05/11/23 1912 3     Pain Loc --      Pain Education --      Exclude from Growth Chart --    No data found.  Updated Vital Signs BP 119/67 (BP Location: Left Arm)   Pulse 64   Temp 97.6 F (36.4 C) (Oral)   Resp 14   SpO2 97%   Visual Acuity Right Eye Distance:   Left Eye Distance:   Bilateral Distance:    Right Eye Near:   Left Eye Near:    Bilateral Near:     Physical Exam Constitutional:      Appearance: Normal appearance. She is normal weight.  Eyes:     Extraocular Movements: Extraocular movements intact.     Pupils: Pupils are equal, round, and reactive to light.  Cardiovascular:     Rate and Rhythm: Normal rate and regular rhythm.  Pulmonary:     Effort: Pulmonary effort is normal.     Breath sounds: Normal breath sounds.  Musculoskeletal:        General: Swelling and deformity present.     Cervical back: Normal range of motion.     Comments: Area of left  forearm swelling   Skin:    General: Skin is warm and dry.     Capillary Refill: Capillary refill takes less than 2 seconds.  Neurological:     General: No focal deficit present.     Mental Status: She is alert and oriented to person, place, and time.  Psychiatric:        Mood and Affect: Mood normal.        Behavior: Behavior normal.      UC Treatments / Results  Labs (all labs ordered are listed, but only abnormal results are displayed) Labs Reviewed - No data to display  EKG   Radiology No results found.  Procedures Procedures (including critical care time)  Medications Ordered in UC Medications - No data to display  Initial Impression / Assessment and Plan / UC Course  I have reviewed the triage vital signs and the nursing notes.  Pertinent labs & imaging results that were available during my care of the patient were reviewed by me and considered in my medical decision making (see chart for details).   X-ray shows a suspected left wrist fracture.   Long arm splint placed by ortho tech, good cap refill distally post splint placement.  Discussed splint care (avoid getting wet, etc), patient to keep splint on until ortho follow-up. RICE advised. Information for orthopedic follow-up given, advised to schedule appointment for the next 3-5 days for follow-up.   May use OTC tylenol and ibuprofen at home as needed for pain.  PDMP reviewed.   Final Clinical Impressions(s) / UC Diagnoses   Final diagnoses:  Closed fracture of left wrist, initial encounter     Discharge Instructions      You have fractured your left wrist. We placed your left wrist in a splint, avoid getting the splint wet. Wear splint at all times and do not remove it until your orthopedic follow-up appointment.  Rest, ice, elevate, and compress the injury to reduce swelling and inflammation.  Please take ibuprofen 800mg   every 8 hours and/or tylenol regular strength 650mg  every 6 hours as needed  with food for pain. You may purchase these medications over the counter (4 200mg  ibuprofen pills= 800mg ).   If you still have pain despite taking ibuprofen regularly, this is called breakthrough pain.  You can use hydrocodone, a narcotic pain medicine, once every 4-6 hours for this.  Once your pain is better controlled, switch back to just ibuprofen/tylenol.  Avoid use of extra strength tylenol while taking hydrocodone since this medication already has tylenol in it.   Schedule an appointment with the orthopedic provider listed on your paperwork for follow-up in the next 3-5 days.  Return if you experience worsening pain, numbness, tingling, skin color changes, or any other concerning symptoms. If symptoms are severe, please go to the ER. I hope you feel better!!         ED Prescriptions   None    PDMP not reviewed this encounter.   Elmer Picker, NP 05/11/23 2029    Elmer Picker, NP 05/11/23 2029
